# Patient Record
Sex: Male | Born: 1995 | Race: Black or African American | Hispanic: No | Marital: Single | Smoking: Current every day smoker
Health system: Southern US, Community
[De-identification: ages and names within clinical notes are randomized; demographics above are authoritative.]

---
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
## 2018-07-03 ENCOUNTER — Encounter (HOSPITAL_COMMUNITY): Payer: Self-pay | Admitting: Family Medicine

## 2018-07-03 ENCOUNTER — Other Ambulatory Visit: Payer: Self-pay

## 2018-07-03 ENCOUNTER — Ambulatory Visit (HOSPITAL_COMMUNITY)
Admission: EM | Admit: 2018-07-03 | Discharge: 2018-07-03 | Disposition: A | Discharge status: Home / Self Care | Payer: Self-pay | Attending: Family Medicine | Admitting: Family Medicine

## 2018-07-03 DIAGNOSIS — L739 Follicular disorder, unspecified: Secondary | ICD-10-CM

## 2018-07-03 MED ORDER — DOXYCYCLINE HYCLATE 100 MG PO TABS: 100.0000 mg | ORAL_TABLET | Freq: Two times a day (BID) | ORAL | 0 refills | Status: DC

## 2018-07-03 NOTE — ED Provider Notes (Signed)
[place]    CSN: [id number] Arrival date & time: 07/03/18  1455     History   Chief Complaint Chief Complaint  Patient presents with  . Rash    HPI Joshua Harvey is a 23 y.o. male.   This is the initial visit for this 23 year old man to [facility]  Patient's chief complaint today is a rash.  He has progressive papules on hips, sides of upper legs, shaft of penis and umbilicus.  The umbilical one is draining.  He's had this before.  It began this time about a week ago.  No fever.     History reviewed. No pertinent past medical history.  There are no active problems to display for this patient.   History reviewed. No pertinent surgical history.     Home Medications    Prior to Admission medications   Medication Sig Start Date End Date Taking? Authorizing Provider  doxycycline (VIBRA-TABS) 100 MG tablet Take 1 tablet (100 mg total) by mouth 2 (two) times daily. 07/03/18   Robyn Haber, MD    Family History History reviewed. No pertinent family history.  Social History Social History   Tobacco Use  . Smoking status: Current Every Day Smoker    Types: Cigarettes  . Smokeless tobacco: Never Used  Substance Use Topics  . Alcohol use: Never    Frequency: Never  . Drug use: Never     Allergies   Patient has no allergy information on record.   Review of Systems Review of Systems  Constitutional: Negative.   Skin: Positive for rash.  All other systems reviewed and are negative.    Physical Exam Triage Vital Signs ED Triage Vitals  Enc Vitals Group     BP      Pulse      Resp      Temp      Temp src      SpO2      Weight      Height      Head Circumference      Peak Flow      Pain Score      Pain Loc      Pain Edu?      Excl. in [place]?    No data found.  Updated Vital Signs There were no vitals taken for this visit.   Physical Exam Vitals signs and nursing note reviewed.  Constitutional:    Appearance: Normal appearance.  HENT:     Head: Normocephalic.  Eyes:     Conjunctiva/sclera: Conjunctivae normal.  Cardiovascular:     Rate and Rhythm: Normal rate.     Pulses: Normal pulses.  Pulmonary:     Effort: Pulmonary effort is normal.  Musculoskeletal: Normal range of motion.  Skin:    General: Skin is warm.     Findings: Erythema and rash present.     Comments: Multiple small furuncles.  Neurological:     General: No focal deficit present.     Mental Status: He is alert.  Psychiatric:        Mood and Affect: Mood normal.      UC Treatments / Results  Labs (all labs ordered are listed, but only abnormal results are displayed) Labs Reviewed - No data to display  EKG None  Radiology No results found.  Procedures Procedures (including critical care time)  Medications Ordered in UC Medications - No data to display  Initial Impression / Assessment and Plan /  UC Course  I have reviewed the triage vital signs and the nursing notes.  Pertinent labs & imaging results that were available during my care of the patient were reviewed by me and considered in my medical decision making (see chart for details).    Final Clinical Impressions(s) / UC Diagnoses   Final diagnoses:  Folliculitis   Discharge Instructions   None    ED Prescriptions    Medication Sig Dispense Auth. Provider   doxycycline (VIBRA-TABS) 100 MG tablet Take 1 tablet (100 mg total) by mouth 2 (two) times daily. 20 tablet Elvina SidleLauenstein, Lessie Manigo, MD     Controlled Substance Prescriptions [place] Controlled Substance Registry consulted? Not Applicable   Elvina SidleLauenstein, Kimble Delaurentis, MD 07/03/18 [phone]

## 2018-07-03 NOTE — ED Triage Notes (Signed)
Per pt he noticed a few raised area on his bottom first then they spread to his stomach and genital area and very itchy.

## 2018-12-17 ENCOUNTER — Other Ambulatory Visit: Payer: Self-pay

## 2018-12-17 ENCOUNTER — Ambulatory Visit (HOSPITAL_COMMUNITY)
Admission: EM | Admit: 2018-12-17 | Discharge: 2018-12-17 | Disposition: A | Discharge status: Home / Self Care | Payer: Self-pay | Attending: Family Medicine | Admitting: Family Medicine

## 2018-12-17 ENCOUNTER — Encounter (HOSPITAL_COMMUNITY): Payer: Self-pay

## 2018-12-17 DIAGNOSIS — Z113 Encounter for screening for infections with a predominantly sexual mode of transmission: Secondary | ICD-10-CM | POA: Insufficient documentation

## 2018-12-17 NOTE — Discharge Instructions (Addendum)
We will cal you with any positive results or you can check your my chart for results.

## 2018-12-17 NOTE — ED Provider Notes (Signed)
[place]    CSN: [id number] Arrival date & time: 12/17/18  1150      History   Chief Complaint Chief Complaint  Patient presents with  . STD Testing    HPI SHISHIR KRANTZ is a 23 y.o. male.   Patient is a 23 year old male the presents today for STD testing.  Patient sexual active with new partner.  Reports patient was having concerning symptoms.  He currently denies any symptoms to include penile discharge, penile swelling, testicle pain or swelling, dysuria, hematuria or urinary frequency.  No rashes.  ROS per HPI      History reviewed. No pertinent past medical history.  There are no active problems to display for this patient.   History reviewed. No pertinent surgical history.     Home Medications    Prior to Admission medications   Not on File    Family History Family History  Family history unknown: Yes    Social History Social History   Tobacco Use  . Smoking status: Current Every Day Smoker    Types: Cigarettes  . Smokeless tobacco: Never Used  Substance Use Topics  . Alcohol use: Never    Frequency: Never  . Drug use: Never     Allergies   Patient has no known allergies.   Review of Systems Review of Systems   Physical Exam Triage Vital Signs ED Triage Vitals  Enc Vitals Group     BP 12/17/18 1221 137/88     Pulse Rate 12/17/18 1221 91     Resp 12/17/18 1221 18     Temp 12/17/18 1221 98.5 F (36.9 C)     Temp Source 12/17/18 1221 Oral     SpO2 12/17/18 1221 100 %     Weight --      Height --      Head Circumference --      Peak Flow --      Pain Score 12/17/18 1222 0     Pain Loc --      Pain Edu? --      Excl. in [place]? --    No data found.  Updated Vital Signs BP 137/88 (BP Location: Right Arm)   Pulse 91   Temp 98.5 F (36.9 C) (Oral)   Resp 18   SpO2 100%   Visual Acuity Right Eye Distance:   Left Eye Distance:   Bilateral Distance:    Right Eye Near:   Left Eye Near:    Bilateral Near:      Physical Exam Vitals signs and nursing note reviewed.  Constitutional:      Appearance: Normal appearance.  HENT:     Head: Normocephalic and atraumatic.     Nose: Nose normal.  Eyes:     Conjunctiva/sclera: Conjunctivae normal.  Neck:     Musculoskeletal: Normal range of motion.  Pulmonary:     Effort: Pulmonary effort is normal.  Musculoskeletal: Normal range of motion.  Skin:    General: Skin is warm and dry.  Neurological:     Mental Status: He is alert.  Psychiatric:        Mood and Affect: Mood normal.      UC Treatments / Results  Labs (all labs ordered are listed, but only abnormal results are displayed) Labs Reviewed  CYTOLOGY, (ORAL, ANAL, URETHRAL) ANCILLARY ONLY    EKG   Radiology No results found.  Procedures Procedures (including critical care time)  Medications Ordered in UC Medications - No  data to display  Initial Impression / Assessment and Plan / UC Course  I have reviewed the triage vital signs and the nursing notes.  Pertinent labs & imaging results that were available during my care of the patient were reviewed by me and considered in my medical decision making (see chart for details).     STD screening-patient asymptomatic and deferring treatment Swab sent for testing labs pending Final Clinical Impressions(s) / UC Diagnoses   Final diagnoses:  Screening for STD (sexually transmitted disease)     Discharge Instructions     We will cal you with any positive results or you can check your my chart for results.     ED Prescriptions    None     PDMP not reviewed this encounter.   Dahlia Byes A, NP 12/17/18 1441

## 2018-12-17 NOTE — ED Triage Notes (Signed)
Pt presents for STD testing; pt states he is not having any symptoms but his partner is.

## 2018-12-20 LAB — CYTOLOGY, (ORAL, ANAL, URETHRAL) ANCILLARY ONLY
Chlamydia: NEGATIVE
Neisseria Gonorrhea: NEGATIVE
Trichomonas: NEGATIVE

## 2019-03-14 ENCOUNTER — Other Ambulatory Visit: Payer: Self-pay

## 2019-03-14 ENCOUNTER — Encounter (HOSPITAL_COMMUNITY): Payer: Self-pay

## 2019-03-14 ENCOUNTER — Ambulatory Visit (HOSPITAL_COMMUNITY)
Admission: EM | Admit: 2019-03-14 | Discharge: 2019-03-14 | Disposition: A | Discharge status: Home / Self Care | Payer: Self-pay | Attending: Family Medicine | Admitting: Family Medicine

## 2019-03-14 DIAGNOSIS — L739 Follicular disorder, unspecified: Secondary | ICD-10-CM

## 2019-03-14 MED ORDER — DOXYCYCLINE HYCLATE 100 MG PO CAPS: 100.0000 mg | ORAL_CAPSULE | Freq: Two times a day (BID) | ORAL | 0 refills | Status: DC

## 2019-03-14 NOTE — Discharge Instructions (Addendum)
Consider antibacterial soap Use hypo-allergenic products Return as needed

## 2019-03-14 NOTE — ED Triage Notes (Signed)
Pt c/o swollen "lump" in right axilla with some itching for approx 1 month. States h/o of similar symptoms in various locations and reports he has been dx with folliculitis in past. Denies fever, chills.

## 2019-03-14 NOTE — ED Provider Notes (Signed)
[facility]    CSN: [id number] Arrival date & time: 03/14/19  1751      History   Chief Complaint Chief Complaint  Patient presents with  . Abscess    HPI Joshua Harvey is a 24 y.o. male.   HPI Patient states he has had recurring skin infections.  He has been diagnosed with folliculitis.  He has been treated with doxycycline.  He has a recurrence of bumps under his right arm.  History reviewed. No pertinent past medical history.  There are no problems to display for this patient.   History reviewed. No pertinent surgical history.     Home Medications    Prior to Admission medications   Medication Sig Start Date End Date Taking? Authorizing Provider  doxycycline (VIBRAMYCIN) 100 MG capsule Take 1 capsule (100 mg total) by mouth 2 (two) times daily. 03/14/19   Eustace Moore, MD    Family History Family History  Family history unknown: Yes    Social History Social History   Tobacco Use  . Smoking status: Current Every Day Smoker    Types: Cigarettes  . Smokeless tobacco: Never Used  Substance Use Topics  . Alcohol use: Yes  . Drug use: Yes    Types: Marijuana     Allergies   Patient has no known allergies.   Review of Systems Review of Systems  Skin: Positive for rash.     Physical Exam Triage Vital Signs ED Triage Vitals  Enc Vitals Group     BP 03/14/19 1813 122/67     Pulse Rate 03/14/19 1813 95     Resp 03/14/19 1813 20     Temp 03/14/19 1813 98.7 F (37.1 C)     Temp Source 03/14/19 1813 Oral     SpO2 03/14/19 1813 100 %     Weight --      Height --      Head Circumference --      Peak Flow --      Pain Score 03/14/19 1811 3     Pain Loc --      Pain Edu? --      Excl. in [place]? --    No data found.  Updated Vital Signs BP 122/67 (BP Location: Right Arm)   Pulse 95   Temp 98.7 F (37.1 C) (Oral)   Resp 20   SpO2 100%   Visual Acuity Right Eye Distance:   Left Eye Distance:   Bilateral Distance:     Right Eye Near:   Left Eye Near:    Bilateral Near:     Physical Exam Constitutional:      General: He is not in acute distress.    Appearance: Normal appearance. He is well-developed and normal weight.     Comments: Pleasant.  Cooperative  HENT:     Head: Normocephalic and atraumatic.     Mouth/Throat:     Comments: Mask is in place Eyes:     Conjunctiva/sclera: Conjunctivae normal.     Pupils: Pupils are equal, round, and reactive to light.  Cardiovascular:     Rate and Rhythm: Normal rate and regular rhythm.     Heart sounds: Normal heart sounds.  Pulmonary:     Effort: Pulmonary effort is normal. No respiratory distress.  Musculoskeletal:        General: Normal range of motion.     Cervical back: Normal range of motion.  Skin:    General: Skin is warm and  dry.     Comments: Right axilla has a 1 cm tender nodule.  There are couple other follicles.  No abscess requiring treatment  Neurological:     Mental Status: He is alert. Mental status is at baseline.  Psychiatric:        Mood and Affect: Mood normal.        Behavior: Behavior normal.      UC Treatments / Results  Labs (all labs ordered are listed, but only abnormal results are displayed) Labs Reviewed - No data to display  EKG   Radiology No results found.  Procedures Procedures (including critical care time)  Medications Ordered in UC Medications - No data to display  Initial Impression / Assessment and Plan / UC Course  I have reviewed the triage vital signs and the nursing notes.  Pertinent labs & imaging results that were available during my care of the patient were reviewed by me and considered in my medical decision making (see chart for details).     *Patient appears healthy.  He has good hygiene.  I explained to him that some people do have sensitive skin with recurring infections in spite of good care.  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 Final Clinical Impressions(s) / UC  Diagnoses   Final diagnoses:  Folliculitis     Discharge Instructions     Consider antibacterial soap Use hypo-allergenic products Return as needed   ED Prescriptions    Medication Sig Dispense Auth. Provider   doxycycline (VIBRAMYCIN) 100 MG capsule Take 1 capsule (100 mg total) by mouth 2 (two) times daily. 20 capsule Raylene Everts, MD     PDMP not reviewed this encounter.   Raylene Everts, MD 03/14/19 [phone]

## 2020-01-29 ENCOUNTER — Emergency Department (HOSPITAL_COMMUNITY)
Admission: EM | Admit: 2020-01-29 | Discharge: 2020-01-29 | Disposition: A | Discharge status: Home / Self Care | Payer: Self-pay | Attending: Emergency Medicine | Admitting: Emergency Medicine

## 2020-01-29 ENCOUNTER — Encounter (HOSPITAL_COMMUNITY): Payer: Self-pay

## 2020-01-29 ENCOUNTER — Emergency Department (HOSPITAL_COMMUNITY): Payer: Self-pay

## 2020-01-29 ENCOUNTER — Other Ambulatory Visit: Payer: Self-pay

## 2020-01-29 DIAGNOSIS — F17210 Nicotine dependence, cigarettes, uncomplicated: Secondary | ICD-10-CM | POA: Insufficient documentation

## 2020-01-29 DIAGNOSIS — M25572 Pain in left ankle and joints of left foot: Secondary | ICD-10-CM | POA: Insufficient documentation

## 2020-01-29 DIAGNOSIS — M25562 Pain in left knee: Secondary | ICD-10-CM | POA: Insufficient documentation

## 2020-01-29 NOTE — Discharge Instructions (Signed)
Please read and follow all provided instructions.  Your diagnoses today include:  1. Acute left ankle pain   2. Acute pain of left knee     Tests performed today include:  An x-ray of the affected area - does NOT show any broken bones  Vital signs. See below for your results today.   Medications prescribed:  Please use over-the-counter NSAID medications (ibuprofen, naproxen) as directed on the packaging for pain if you do not have any reasons not to take these medications just as weak kidneys or a history of bleeding in your stomach or gut.   Take any prescribed medications only as directed.  Home care instructions:   Follow any educational materials contained in this packet  Follow R.I.C.E. Protocol:  R - rest your injury   I  - use ice on injury without applying directly to skin  C - compress injury with bandage or splint  E - elevate the injury as much as possible  Follow-up instructions: Please follow-up with your primary care provider if you continue to have significant pain in 1 week. In this case you may have a more severe injury that requires further care.   Return instructions:   Please return if your toes or feet are numb or tingling, appear gray or blue, or you have severe pain (also elevate the leg and loosen splint or wrap if you were given one)  Please return to the Emergency Department if you experience worsening symptoms.   Please return if you have any other emergent concerns.  Additional Information:  Your vital signs today were: BP 125/68 (BP Location: Right Arm)    Pulse 82    Temp 99.5 F (37.5 C) (Oral)    Resp 16    SpO2 98%  If your blood pressure (BP) was elevated above 135/85 this visit, please have this repeated by your doctor within one month. -------------- If prescribed crutches for your injury: use crutches with non-weight bearing for the first few days. Then, you may walk as the pain allows, or as instructed. Start gradually with  weight bearing on the affected side. Once you can walk pain free, then try jogging. When you can run forwards, then you can try moving side-to-side. If you cannot walk without crutches in one week, you need a re-check. --------------

## 2020-01-29 NOTE — ED Provider Notes (Signed)
[facility] EMERGENCY DEPARTMENT Provider Note   CSN: [id number] Arrival date & time: 01/29/20  6213     History No chief complaint on file.   Joshua Harvey is a 25 y.o. male.  Patient presents the emergency department for evaluation of acute onset of left knee and ankle pain starting last night.  Patient states that he was "wrestling around" and afterwards noted that he was having pain and difficulty with ambulation.  No treatments prior to arrival.  No weakness or numbness.  Denies other injuries.        History reviewed. No pertinent past medical history.  There are no problems to display for this patient.   History reviewed. No pertinent surgical history.     Family History  Family history unknown: Yes    Social History   Tobacco Use  . Smoking status: Current Every Day Smoker    Types: Cigarettes  . Smokeless tobacco: Never Used  Vaping Use  . Vaping Use: Never used  Substance Use Topics  . Alcohol use: Yes  . Drug use: Yes    Types: Marijuana    Home Medications Prior to Admission medications   Not on File    Allergies    Patient has no known allergies.  Review of Systems   Review of Systems  Constitutional: Negative for activity change.  Musculoskeletal: Positive for arthralgias and gait problem. Negative for back pain, joint swelling and neck pain.  Skin: Negative for wound.  Neurological: Negative for weakness and numbness.    Physical Exam Updated Vital Signs BP 125/68 (BP Location: Right Arm)   Pulse 82   Temp 99.5 F (37.5 C) (Oral)   Resp 16   SpO2 98%   Physical Exam Vitals and nursing note reviewed.  Constitutional:      Appearance: He is well-developed and well-nourished.  HENT:     Head: Normocephalic and atraumatic.  Eyes:     Conjunctiva/sclera: Conjunctivae normal.  Cardiovascular:     Pulses: Normal pulses. No decreased pulses.  Musculoskeletal:        General: Tenderness present. No edema.      Cervical back: Normal range of motion and neck supple.     Left hip: No tenderness or bony tenderness. Normal range of motion.     Left knee: No swelling or effusion. Normal range of motion. Tenderness present over the medial joint line.     Left ankle: Tenderness present over the medial malleolus. No proximal fibula tenderness. Normal range of motion.  Skin:    General: Skin is warm and dry.  Neurological:     Mental Status: He is alert.     Sensory: No sensory deficit.     Comments: Motor, sensation, and vascular distal to the injury is fully intact.   Psychiatric:        Mood and Affect: Mood and affect normal.     ED Results / Procedures / Treatments   Labs (all labs ordered are listed, but only abnormal results are displayed) Labs Reviewed - No data to display  EKG None  Radiology DG Ankle Complete Left  Result Date: 01/29/2020 CLINICAL DATA:  Left ankle pain, no injury. EXAM: LEFT ANKLE COMPLETE - 3+ VIEW COMPARISON:  None. FINDINGS: There is no evidence of fracture, dislocation, or joint effusion. There is no evidence of arthropathy or other focal bone abnormality. Soft tissues are unremarkable. IMPRESSION: Negative. Electronically Signed   By: Sherian Rein M.D.   On: 01/29/2020  09:47   DG Knee Complete 4 Views Left  Result Date: 01/29/2020 CLINICAL DATA:  Left knee pain.  No injury. EXAM: LEFT KNEE - COMPLETE 4+ VIEW COMPARISON:  None. FINDINGS: No evidence of fracture, dislocation, or joint effusion. No evidence of arthropathy or other focal bone abnormality. Soft tissues are unremarkable. IMPRESSION: Negative. Electronically Signed   By: Sherian Rein M.D.   On: 01/29/2020 09:47    Procedures Procedures (including critical care time)  Medications Ordered in ED Medications - No data to display  ED Course  I have reviewed the triage vital signs and the nursing notes.  Pertinent labs & imaging results that were available during my care of the patient were reviewed by  me and considered in my medical decision making (see chart for details).  Patient seen and examined.  X-ray results reviewed.  Vital signs reviewed and are as follows: BP 125/68 (BP Location: Right Arm)   Pulse 82   Temp 99.5 F (37.5 C) (Oral)   Resp 16   SpO2 98%    We will provide with crutches and Ace wrap.  Patient was counseled on RICE protocol and told to rest injury, use ice for no longer than 15 minutes every hour, compress the area, and elevate above the level of their heart as much as possible to reduce swelling. Questions answered. Patient verbalized understanding.       MDM Rules/Calculators/A&P                          Patient with knee and ankle injury yesterday.  Imaging negative.  Exam reassuring.  Suspect sprain.  Lower extremities neurovascularly intact.   Final Clinical Impression(s) / ED Diagnoses Final diagnoses:  Acute left ankle pain  Acute pain of left knee    Rx / DC Orders ED Discharge Orders    None       Renne Crigler, Cordelia Poche 01/29/20 1011    Tilden Fossa, MD 01/29/20 1224

## 2020-01-29 NOTE — ED Triage Notes (Signed)
Patient complains of left knee and ankle pain after tweaking last night, pain with ambulation

## 2020-01-29 NOTE — ED Notes (Signed)
Pt educated on how to use crutches and able to demonstrate proper use.  Left knee wrapped with ACE bandage, pt educated how to wrap knee and precautions to use

## 2020-05-20 ENCOUNTER — Ambulatory Visit (INDEPENDENT_AMBULATORY_CARE_PROVIDER_SITE_OTHER): Payer: Self-pay

## 2020-05-20 ENCOUNTER — Encounter (HOSPITAL_COMMUNITY): Payer: Self-pay | Admitting: Emergency Medicine

## 2020-05-20 ENCOUNTER — Ambulatory Visit (HOSPITAL_COMMUNITY)
Admission: EM | Admit: 2020-05-20 | Discharge: 2020-05-20 | Disposition: A | Discharge status: Home / Self Care | Payer: Self-pay | Attending: Internal Medicine | Admitting: Internal Medicine

## 2020-05-20 ENCOUNTER — Other Ambulatory Visit: Payer: Self-pay

## 2020-05-20 DIAGNOSIS — M25572 Pain in left ankle and joints of left foot: Secondary | ICD-10-CM

## 2020-05-20 DIAGNOSIS — M79641 Pain in right hand: Secondary | ICD-10-CM

## 2020-05-20 DIAGNOSIS — S62306A Unspecified fracture of fifth metacarpal bone, right hand, initial encounter for closed fracture: Secondary | ICD-10-CM

## 2020-05-20 DIAGNOSIS — M7989 Other specified soft tissue disorders: Secondary | ICD-10-CM

## 2020-05-20 DIAGNOSIS — W19XXXA Unspecified fall, initial encounter: Secondary | ICD-10-CM

## 2020-05-20 DIAGNOSIS — M79643 Pain in unspecified hand: Secondary | ICD-10-CM
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
## 2020-05-20 NOTE — Progress Notes (Signed)
Orthopedic Tech Progress Note Patient Details:  RY MOODY 1995/10/22 [id number]  Ortho Devices Type of Ortho Device: Ulna gutter splint Ortho Device/Splint Location: Right Upper Extremity Ortho Device/Splint Interventions: Ordered,Application   Post Interventions Patient Tolerated: Well Instructions Provided: Care of device,Poper ambulation with device   Laquonda Welby P Harle Stanford 05/20/2020, 1:36 PM
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
## 2020-05-20 NOTE — Discharge Instructions (Addendum)
-  Call orthopedist for for follow-up and permanent cast. Information below. Call them tomorrow to schedule this appointment. -For pain, Take Tylenol 1000 mg 3 times daily, and ibuprofen 800 mg 3 times daily with food.  You can take these together, or alternate every 3-4 hours. You can also try ice. Elevate the hand to reduce swelling.

## 2020-09-29 ENCOUNTER — Other Ambulatory Visit: Payer: Self-pay

## 2020-09-29 ENCOUNTER — Encounter (HOSPITAL_COMMUNITY): Payer: Self-pay

## 2020-09-29 ENCOUNTER — Ambulatory Visit (HOSPITAL_COMMUNITY)
Admission: EM | Admit: 2020-09-29 | Discharge: 2020-09-29 | Disposition: A | Discharge status: Home / Self Care | Payer: Self-pay | Attending: Emergency Medicine | Admitting: Emergency Medicine

## 2020-09-29 DIAGNOSIS — L03319 Cellulitis of trunk, unspecified: Secondary | ICD-10-CM

## 2020-09-29 DIAGNOSIS — L03111 Cellulitis of right axilla: Secondary | ICD-10-CM

## 2020-09-29 DIAGNOSIS — L02411 Cutaneous abscess of right axilla: Secondary | ICD-10-CM

## 2020-09-29 DIAGNOSIS — L02219 Cutaneous abscess of trunk, unspecified: Secondary | ICD-10-CM

## 2020-09-29 MED ORDER — CEPHALEXIN 500 MG PO CAPS: 500.0000 mg | ORAL_CAPSULE | Freq: Four times a day (QID) | ORAL | 0 refills | Status: AC

## 2020-09-29 MED ORDER — LIDOCAINE HCL (PF) 1 % IJ SOLN
INTRAMUSCULAR | Status: AC
  Filled 2020-09-29: qty 2

## 2020-09-29 MED ORDER — CEFTRIAXONE SODIUM 1 G IJ SOLR
1.0000 g | Freq: Once | INTRAMUSCULAR | Status: AC
  Administered 2020-09-29: 1 g via INTRAMUSCULAR

## 2020-09-29 MED ORDER — CEFTRIAXONE SODIUM 1 G IJ SOLR
INTRAMUSCULAR | Status: AC
  Filled 2020-09-29: qty 10

## 2020-09-29 MED ORDER — LIDOCAINE HCL (PF) 2 % IJ SOLN
INTRAMUSCULAR | Status: AC
  Filled 2020-09-29: qty 5

## 2020-09-29 NOTE — ED Triage Notes (Signed)
Pt present abscess underneath right armpit. Pt states the area is getting bigger and hard from him to put him arm down. Symptoms started four days ago.

## 2020-09-29 NOTE — ED Provider Notes (Signed)
[facility]    CSN: [id number] Arrival date & time: 09/29/20  1600      History   Chief Complaint Chief Complaint  Patient presents with   Abscess    HPI Joshua Harvey is a 25 y.o. male.   Patient complains of a progressively enlarging, painful abscess under his right axilla.  States the area is so painful now he is unable to lower his arm.  Patient denies fever, aches, chills, nausea, vomiting, diarrhea.  Vital signs are stable today.   Abscess Abscess location: Right axilla. Size:  8 cm x 5 cm Abscess quality: fluctuance, painful and redness   Red streaking: no   Duration:  4 days Progression:  Worsening Pain details:    Quality:  Pressure and hot   Severity:  Moderate   Duration:  4 days   Timing:  Constant   Progression:  Worsening Chronicity:  New Relieved by:  None tried Worsened by:  Nothing Ineffective treatments:  None tried Associated symptoms: no anorexia, no fatigue, no fever, no headaches, no nausea and no vomiting   Risk factors: prior abscess   Risk factors: no family hx of MRSA and no hx of MRSA    History reviewed. No pertinent past medical history.  There are no problems to display for this patient.   History reviewed. No pertinent surgical history.     Home Medications    Prior to Admission medications   Medication Sig Start Date End Date Taking? Authorizing Provider  cephALEXin (KEFLEX) 500 MG capsule Take 1 capsule (500 mg total) by mouth 4 (four) times daily for 10 days. 09/29/20 10/09/20 Yes Theadora Rama, PA-C    Family History Family History  Family history unknown: Yes    Social History Social History   Tobacco Use   Smoking status: Every Day    Types: Cigarettes   Smokeless tobacco: Never  Vaping Use   Vaping Use: Never used  Substance Use Topics   Alcohol use: Yes   Drug use: Yes    Types: Marijuana     Allergies   Patient has no known allergies.   Review of Systems Review of Systems   Constitutional:  Negative for fatigue and fever.  Gastrointestinal:  Negative for anorexia, nausea and vomiting.  Neurological:  Negative for headaches.    Physical Exam Triage Vital Signs ED Triage Vitals  Enc Vitals Group     BP 09/29/20 1627 129/87     Pulse Rate 09/29/20 1627 89     Resp 09/29/20 1627 18     Temp 09/29/20 1627 97.8 F (36.6 C)     Temp Source 09/29/20 1627 Oral     SpO2 09/29/20 1627 99 %     Weight --      Height --      Head Circumference --      Peak Flow --      Pain Score 09/29/20 1626 8     Pain Loc --      Pain Edu? --      Excl. in GC? --    No data found.  Updated Vital Signs BP 129/87 (BP Location: Left Arm)   Pulse 89   Temp 97.8 F (36.6 C) (Oral)   Resp 18   SpO2 99%   Visual Acuity Right Eye Distance:   Left Eye Distance:   Bilateral Distance:    Right Eye Near:   Left Eye Near:    Bilateral Near:  Physical Exam Constitutional:      Appearance: Normal appearance.  HENT:     Head: Normocephalic and atraumatic.  Skin:    General: Skin is warm and dry.     Findings: Lesion (3 cm x 5 cm fluctuant abscess present at right axilla.) present.  Neurological:     Mental Status: He is alert.     UC Treatments / Results  Labs (all labs ordered are listed, but only abnormal results are displayed) Labs Reviewed - No data to display  EKG   Radiology No results found.  Procedures Incision and Drainage  Date/Time: 09/29/2020 6:09 PM Performed by: Theadora Rama, PA-C Authorized by: Theadora Rama, PA-C   Consent:    Consent obtained:  Verbal   Consent given by:  Patient   Risks, benefits, and alternatives were discussed: yes     Risks discussed:  Bleeding, incomplete drainage, pain, infection and damage to other organs   Alternatives discussed:  No treatment, delayed treatment, alternative treatment and observation Universal protocol:    Procedure explained and questions answered to patient or proxy's  satisfaction: yes     Site/side marked: yes     Immediately prior to procedure, a time out was called: yes     Patient identity confirmed:  Verbally with patient Location:    Type:  Abscess   Size:  3 cm x 5 cm   Location: Right axilla. Pre-procedure details:    Skin preparation:  Povidone-iodine Sedation:    Sedation type:  None Anesthesia:    Anesthesia method:  Local infiltration Procedure type:    Complexity:  Simple Procedure details:    Ultrasound guidance: no     Needle aspiration: no     Incision types:  Stab incision   Incision depth:  Subcutaneous   Wound management:  Probed and deloculated, irrigated with saline and extensive cleaning   Drainage:  Bloody and purulent   Drainage amount:  Copious   Wound treatment:  Wound left open and drain placed   Packing materials:  1/4 in iodoform gauze Post-procedure details:    Procedure completion:  Tolerated Comments:     Ductions provided to patient to advance iodoform by 1 inch daily until gone.  Patient also advised to follow-up in 3 days to have wound recheck. (including critical care time)  Medications Ordered in UC Medications  cefTRIAXone (ROCEPHIN) injection 1 g (1 g Intramuscular Given 09/29/20 1759)    Initial Impression / Assessment and Plan / UC Course  I have reviewed the triage vital signs and the nursing notes.  Pertinent labs & imaging results that were available during my care of the patient were reviewed by me and considered in my medical decision making (see chart for details).     Patient tolerated incision and drainage well.  Patient provided with injection of ceftriaxone for broad-spectrum antibiotic coverage.  We will follow-up with Keflex for 10 days.  Education for wound care provided to patient.  Patient asked to follow-up in 3 days for wound recheck. Final Clinical Impressions(s) / UC Diagnoses   Final diagnoses:  None     Discharge Instructions      These be sure you pull out  approximately 1 inch of the packing daily from the wound and trim it off.  Please take your antibiotics as prescribed.  Please follow up in 3 days to have your wound rechecked either here or with your primary care provider.   Sooner should you begin to experience increased pain  from the wound, foul-smelling drainage, increased redness surrounding the wound.     ED Prescriptions     Medication Sig Dispense Auth. Provider   cephALEXin (KEFLEX) 500 MG capsule Take 1 capsule (500 mg total) by mouth 4 (four) times daily for 10 days. 40 capsule Theadora Rama, PA-C      PDMP not reviewed this encounter.   Theadora Rama, PA-C 09/29/20 1851

## 2020-09-29 NOTE — Discharge Instructions (Addendum)
These be sure you pull out approximately 1 inch of the packing daily from the wound and trim it off.  Please take your antibiotics as prescribed.  Please follow up in 3 days to have your wound rechecked either here or with your primary care provider.   Sooner should you begin to experience increased pain from the wound, foul-smelling drainage, increased redness surrounding the wound.

## 2020-12-03 ENCOUNTER — Encounter (HOSPITAL_COMMUNITY): Payer: Self-pay | Admitting: Emergency Medicine

## 2020-12-03 ENCOUNTER — Ambulatory Visit (HOSPITAL_COMMUNITY)
Admission: EM | Admit: 2020-12-03 | Discharge: 2020-12-03 | Disposition: A | Discharge status: Home / Self Care | Payer: Self-pay | Attending: Internal Medicine | Admitting: Internal Medicine

## 2020-12-03 DIAGNOSIS — L739 Follicular disorder, unspecified: Secondary | ICD-10-CM

## 2020-12-03 MED ORDER — DOXYCYCLINE HYCLATE 100 MG PO CAPS: 100.0000 mg | ORAL_CAPSULE | Freq: Two times a day (BID) | ORAL | 0 refills | Status: AC

## 2020-12-03 NOTE — Discharge Instructions (Signed)
Take medications as prescribed Warm compresses to the right axilla If the area develops fluctuance please return to urgent care for incision and drainage.

## 2020-12-03 NOTE — ED Triage Notes (Signed)
Pt c/o new abscess/bump came up in right axilla a couple days ago. Reports had one drained before but new one has came up and was told to come back if gets more.

## 2020-12-03 NOTE — ED Provider Notes (Signed)
[facility]    CSN: [id number] Arrival date & time: 12/03/20  1125      History   Chief Complaint Chief Complaint  Patient presents with   Abscess    HPI Joshua Harvey is a 25 y.o. male comes to the urgent care with painful swelling in the right axilla over the past couple of days.  Patient has a history of folliculitis in the right axilla.  No fever or chills.  Pain is of moderate severity, throbbing, aggravated by palpation.  Patient has not tried any over-the-counter medications yet.  No drainage noted.Marland Kitchen   HPI  History reviewed. No pertinent past medical history.  There are no problems to display for this patient.   History reviewed. No pertinent surgical history.     Home Medications    Prior to Admission medications   Medication Sig Start Date End Date Taking? Authorizing Provider  doxycycline (VIBRAMYCIN) 100 MG capsule Take 1 capsule (100 mg total) by mouth 2 (two) times daily for 7 days. 12/03/20 12/10/20 Yes Jailon Schaible, Britta Mccreedy, MD    Family History Family History  Family history unknown: Yes    Social History Social History   Tobacco Use   Smoking status: Every Day    Types: Cigarettes   Smokeless tobacco: Never  Vaping Use   Vaping Use: Never used  Substance Use Topics   Alcohol use: Yes   Drug use: Yes    Types: Marijuana     Allergies   Patient has no known allergies.   Review of Systems Review of Systems  Skin:  Positive for rash. Negative for color change and wound.    Physical Exam Triage Vital Signs ED Triage Vitals  Enc Vitals Group     BP 12/03/20 1355 115/69     Pulse Rate 12/03/20 1355 71     Resp 12/03/20 1355 15     Temp 12/03/20 1355 98.6 F (37 C)     Temp Source 12/03/20 1355 Oral     SpO2 12/03/20 1355 96 %     Weight --      Height --      Head Circumference --      Peak Flow --      Pain Score 12/03/20 1354 0     Pain Loc --      Pain Edu? --      Excl. in GC? --    No data found.  Updated  Vital Signs BP 115/69 (BP Location: Right Arm)   Pulse 71   Temp 98.6 F (37 C) (Oral)   Resp 15   SpO2 96%   Visual Acuity Right Eye Distance:   Left Eye Distance:   Bilateral Distance:    Right Eye Near:   Left Eye Near:    Bilateral Near:     Physical Exam Vitals and nursing note reviewed.  Constitutional:      General: He is not in acute distress.    Appearance: He is not ill-appearing.  Musculoskeletal:        General: Normal range of motion.  Skin:    Comments: Tender swelling in the right axillary region.  Neurological:     Mental Status: He is alert.     UC Treatments / Results  Labs (all labs ordered are listed, but only abnormal results are displayed) Labs Reviewed - No data to display  EKG   Radiology No results found.  Procedures Procedures (including critical care time)  Medications  Ordered in UC Medications - No data to display  Initial Impression / Assessment and Plan / UC Course  I have reviewed the triage vital signs and the nursing notes.  Pertinent labs & imaging results that were available during my care of the patient were reviewed by me and considered in my medical decision making (see chart for details).     Folliculitis of the right axilla: Doxycycline 100 mg twice daily for 7 days Ibuprofen as needed for pain Warm compresses Return to urgent care if symptoms worsen. Final Clinical Impressions(s) / UC Diagnoses   Final diagnoses:  Folliculitis of right axilla     Discharge Instructions      Take medications as prescribed Warm compresses to the right axilla If the area develops fluctuance please return to urgent care for incision and drainage.   ED Prescriptions     Medication Sig Dispense Auth. Provider   doxycycline (VIBRAMYCIN) 100 MG capsule Take 1 capsule (100 mg total) by mouth 2 (two) times daily for 7 days. 14 capsule Taunia Frasco, Britta Mccreedy, MD      PDMP not reviewed this encounter.   Merrilee Jansky,  MD 12/03/20 1438

## 2021-01-10 ENCOUNTER — Ambulatory Visit (HOSPITAL_COMMUNITY): Admission: EM | Admit: 2021-01-10 | Discharge: 2021-01-10 | Discharge status: Against Medical Advice | Payer: Self-pay

## 2021-01-10 ENCOUNTER — Other Ambulatory Visit: Payer: Self-pay

## 2021-01-10 NOTE — ED Notes (Signed)
Called in lobby no answered.  

## 2021-01-10 NOTE — ED Notes (Signed)
Pt no answered.

## 2021-01-10 NOTE — ED Notes (Signed)
Called no answered.  

## 2021-07-01 ENCOUNTER — Encounter (HOSPITAL_COMMUNITY): Payer: Self-pay | Admitting: *Deleted

## 2021-07-01 ENCOUNTER — Other Ambulatory Visit: Payer: Self-pay

## 2021-07-01 ENCOUNTER — Ambulatory Visit (HOSPITAL_COMMUNITY)
Admission: EM | Admit: 2021-07-01 | Discharge: 2021-07-01 | Disposition: A | Discharge status: Home / Self Care | Payer: PRIVATE HEALTH INSURANCE | Attending: Urgent Care | Admitting: Urgent Care

## 2021-07-01 DIAGNOSIS — B370 Candidal stomatitis: Secondary | ICD-10-CM | POA: Diagnosis not present

## 2021-07-01 LAB — POCT RAPID STREP A, ED / UC: Streptococcus, Group A Screen (Direct): NEGATIVE

## 2021-07-01 MED ORDER — NYSTATIN 100000 UNIT/ML MT SUSP: 500000.0000 [IU] | Freq: Four times a day (QID) | OROMUCOSAL | 0 refills | Status: DC

## 2021-07-01 NOTE — ED Triage Notes (Signed)
Pt reports this is the second day his mouth has been itching and swollen. No resp.distress and Pt speaking in full sentences.

## 2021-07-01 NOTE — ED Provider Notes (Signed)
[facility]    CSN: [id number] Arrival date & time: 07/01/21  1404      History   Chief Complaint Chief Complaint  Patient presents with   thoat swelling   roof of mouth itching    HPI Joshua Harvey is a 26 y.o. male.   26 year old male presents today with a 1 day history of feeling like the roof of his mouth is swollen and itchy.  He states it hurts to swallow.  He denies any allergies.  He denies any hives, dysphagia, drooling, fever, rash, nausea vomiting diarrhea, abdominal pain.  He states it started abruptly yesterday, but not in association with anything he can think of.  He was uncertain what to take for it, so tried no over-the-counter remedies. He does use mouthwash and felt this helped some.  He has no chronic medical conditions and takes no daily medications.  He denies any shortness of breath, difficulty breathing, wheezing.  He states it has not worsened and has been constant and stable since yesterday. Pt admits to smoking.    History reviewed. No pertinent past medical history.  There are no problems to display for this patient.   History reviewed. No pertinent surgical history.     Home Medications    Prior to Admission medications   Medication Sig Start Date End Date Taking? Authorizing Provider  nystatin (MYCOSTATIN) 100000 UNIT/ML suspension Take 5 mLs (500,000 Units total) by mouth 4 (four) times daily. Swish in mouth for 5 minutes prior to spitting out 07/01/21  Yes Ieisha Gao, Jodelle Gross, PA    Family History Family History  Family history unknown: Yes    Social History Social History   Tobacco Use   Smoking status: Every Day    Types: Cigarettes   Smokeless tobacco: Never  Vaping Use   Vaping Use: Never used  Substance Use Topics   Alcohol use: Yes   Drug use: Yes    Types: Marijuana     Allergies   Patient has no known allergies.   Review of Systems Review of Systems As per hpi  Physical Exam Triage Vital Signs ED  Triage Vitals  Enc Vitals Group     BP 07/01/21 1519 (!) 129/95     Pulse Rate 07/01/21 1519 73     Resp 07/01/21 1519 18     Temp 07/01/21 1519 99 F (37.2 C)     Temp src --      SpO2 07/01/21 1519 100 %     Weight --      Height --      Head Circumference --      Peak Flow --      Pain Score 07/01/21 1518 9     Pain Loc --      Pain Edu? --      Excl. in GC? --    No data found.  Updated Vital Signs BP (!) 129/95   Pulse 73   Temp 99 F (37.2 C)   Resp 18   SpO2 100%   Visual Acuity Right Eye Distance:   Left Eye Distance:   Bilateral Distance:    Right Eye Near:   Left Eye Near:    Bilateral Near:     Physical Exam Vitals and nursing note reviewed.  Constitutional:      General: He is not in acute distress.    Appearance: Normal appearance. He is well-developed and normal weight. He is not ill-appearing, toxic-appearing or diaphoretic.  HENT:     Head: Normocephalic and atraumatic.     Right Ear: Tympanic membrane, ear canal and external ear normal. There is no impacted cerumen.     Left Ear: Tympanic membrane, ear canal and external ear normal. There is no impacted cerumen.     Nose: Nose normal. No congestion or rhinorrhea.     Mouth/Throat:     Lips: Pink.     Mouth: Mucous membranes are moist.     Pharynx: Oropharynx is clear. Uvula midline. Posterior oropharyngeal erythema present. No uvula swelling.     Tonsils: No tonsillar exudate or tonsillar abscesses.     Comments: Hard and soft palate with a white film, able to be scraped off. Small developing ulcerations to soft palate. No swelling to uvula Eyes:     General: No scleral icterus.       Right eye: No discharge.        Left eye: No discharge.     Extraocular Movements: Extraocular movements intact.     Conjunctiva/sclera: Conjunctivae normal.     Pupils: Pupils are equal, round, and reactive to light.  Cardiovascular:     Rate and Rhythm: Normal rate and regular rhythm.     Heart sounds: No  murmur heard. Pulmonary:     Effort: Pulmonary effort is normal. No respiratory distress.     Breath sounds: Normal breath sounds.  Abdominal:     Palpations: Abdomen is soft.     Tenderness: There is no abdominal tenderness.  Musculoskeletal:        General: No swelling.     Cervical back: Normal range of motion and neck supple. No rigidity or tenderness.  Lymphadenopathy:     Cervical: No cervical adenopathy.  Skin:    General: Skin is warm and dry.     Capillary Refill: Capillary refill takes less than 2 seconds.  Neurological:     Mental Status: He is alert.  Psychiatric:        Mood and Affect: Mood normal.     UC Treatments / Results  Labs (all labs ordered are listed, but only abnormal results are displayed) Labs Reviewed  POCT RAPID STREP A, ED / UC    EKG   Radiology No results found.  Procedures Procedures (including critical care time)  Medications Ordered in UC Medications - No data to display  Initial Impression / Assessment and Plan / UC Course  I have reviewed the triage vital signs and the nursing notes.  Pertinent labs & imaging results that were available during my care of the patient were reviewed by me and considered in my medical decision making (see chart for details).     Oral thrush - rapid strep obtained, negative. Suspect pain secondary to thrush which appears to be causing small ulceration to soft palate. Nystatin swish and spit discussed. RTC precautions reviewed.  Final Clinical Impressions(s) / UC Diagnoses   Final diagnoses:  Oral thrush     Discharge Instructions      Your strep swab is negative. Your discomfort is likely coming from oral thrush Please start using the mouthwash prescribed four times daily, please swish in mouth for 5 minutes prior to spitting out. Return to clinic if symptoms do not improve or any mild changes. Head to ER if any difficulty swallowing or breathing.      ED Prescriptions      Medication Sig Dispense Auth. Provider   nystatin (MYCOSTATIN) 100000 UNIT/ML suspension Take 5 mLs (500,000 Units  total) by mouth 4 (four) times daily. Swish in mouth for 5 minutes prior to spitting out 473 mL Tyesha Joffe L, PA      PDMP not reviewed this encounter.   Maretta Bees, [place] 07/01/21 1622

## 2021-07-01 NOTE — Discharge Instructions (Addendum)
Your strep swab is negative. Your discomfort is likely coming from oral thrush Please start using the mouthwash prescribed four times daily, please swish in mouth for 5 minutes prior to spitting out. Return to clinic if symptoms do not improve or any mild changes. Head to ER if any difficulty swallowing or breathing.

## 2021-07-02 ENCOUNTER — Telehealth (HOSPITAL_COMMUNITY): Payer: Self-pay | Admitting: Emergency Medicine

## 2021-07-02 MED ORDER — NYSTATIN 100000 UNIT/ML MT SUSP: 500000.0000 [IU] | Freq: Four times a day (QID) | OROMUCOSAL | 0 refills | Status: DC

## 2021-07-02 NOTE — Telephone Encounter (Signed)
Pharmacy's power was out yesterday and prescription did not go through, resending

## 2021-09-01 ENCOUNTER — Telehealth: Payer: PRIVATE HEALTH INSURANCE

## 2021-09-05 ENCOUNTER — Encounter (HOSPITAL_COMMUNITY): Payer: Self-pay

## 2021-09-05 ENCOUNTER — Ambulatory Visit (HOSPITAL_COMMUNITY)
Admission: EM | Admit: 2021-09-05 | Discharge: 2021-09-05 | Disposition: A | Discharge status: Home / Self Care | Payer: PRIVATE HEALTH INSURANCE | Attending: Internal Medicine | Admitting: Internal Medicine

## 2021-09-05 DIAGNOSIS — R360 Urethral discharge without blood: Secondary | ICD-10-CM | POA: Diagnosis present

## 2021-09-05 MED ORDER — CEFTRIAXONE SODIUM 500 MG IJ SOLR
INTRAMUSCULAR | Status: AC
  Filled 2021-09-05: qty 500

## 2021-09-05 MED ORDER — CEFTRIAXONE SODIUM 500 MG IJ SOLR
500.0000 mg | Freq: Once | INTRAMUSCULAR | Status: DC
  Administered 2021-09-05: 500 mg via INTRAMUSCULAR

## 2021-09-05 NOTE — Discharge Instructions (Signed)
Please abstain from sexual intercourse for 7 days to allow the medications to work Will call you with recommendations if labs are abnormal I will recommend you follow-up with your clinic who did testing for syphilis and HIV for the results Safe sex practices recommended Return to urgent care if you have any other concerns.

## 2021-09-05 NOTE — ED Triage Notes (Signed)
Pt c/o burning on urination and penile discharge x5 days.

## 2021-09-06 LAB — CYTOLOGY, (ORAL, ANAL, URETHRAL) ANCILLARY ONLY
Chlamydia: NEGATIVE
Comment: NEGATIVE
Comment: NEGATIVE
Comment: NORMAL
Neisseria Gonorrhea: POSITIVE — AB
Trichomonas: NEGATIVE

## 2021-09-07 NOTE — ED Provider Notes (Signed)
[facility]    CSN: [id number] Arrival date & time: 09/05/21  1154      History   Chief Complaint Chief Complaint  Patient presents with   SEXUALLY TRANSMITTED DISEASE    HPI Joshua Harvey is a 26 y.o. male comes to the urgent care with a 5-day history of penile discharge.  Patient engaged in sexual intercourse with a random sexual partner.  Intercourse with vaginal and unprotected.  He describes the discharge as yellowish and associated with burning sensation on urination.  No groin or scrotal swelling.  No ulcerations or rash on the penis or in the groin area.  No abdominal pain. HPI  History reviewed. No pertinent past medical history.  There are no problems to display for this patient.   History reviewed. No pertinent surgical history.     Home Medications    Prior to Admission medications   Not on File    Family History Family History  Family history unknown: Yes    Social History Social History   Tobacco Use   Smoking status: Every Day    Types: Cigarettes   Smokeless tobacco: Never  Vaping Use   Vaping Use: Never used  Substance Use Topics   Alcohol use: Yes   Drug use: Yes    Types: Marijuana     Allergies   Patient has no known allergies.   Review of Systems Review of Systems  Respiratory: Negative.    Gastrointestinal: Negative.   Genitourinary:  Positive for dysuria, penile discharge and penile pain. Negative for penile swelling, scrotal swelling, testicular pain and urgency.     Physical Exam Triage Vital Signs ED Triage Vitals [09/05/21 1213]  Enc Vitals Group     BP 109/69     Pulse Rate 96     Resp 18     Temp 98.4 F (36.9 C)     Temp Source Oral     SpO2 100 %     Weight      Height      Head Circumference      Peak Flow      Pain Score 7     Pain Loc      Pain Edu?      Excl. in GC?    No data found.  Updated Vital Signs BP 109/69 (BP Location: Left Arm)   Pulse 96   Temp 98.4 F (36.9 C)  (Oral)   Resp 18   SpO2 100%   Visual Acuity Right Eye Distance:   Left Eye Distance:   Bilateral Distance:    Right Eye Near:   Left Eye Near:    Bilateral Near:     Physical Exam Vitals and nursing note reviewed.  Constitutional:      General: He is not in acute distress.    Appearance: He is not ill-appearing.  Cardiovascular:     Rate and Rhythm: Normal rate and regular rhythm.  Abdominal:     General: Abdomen is flat. Bowel sounds are normal.     Palpations: Abdomen is soft.  Musculoskeletal:        General: Normal range of motion.  Neurological:     Mental Status: He is alert.      UC Treatments / Results  Labs (all labs ordered are listed, but only abnormal results are displayed) Labs Reviewed  CYTOLOGY, (ORAL, ANAL, URETHRAL) ANCILLARY ONLY - Abnormal; Notable for the following components:      Result Value  Neisseria Gonorrhea Positive (*)    All other components within normal limits    EKG   Radiology No results found.  Procedures Procedures (including critical care time)  Medications Ordered in UC Medications - No data to display  Initial Impression / Assessment and Plan / UC Course  I have reviewed the triage vital signs and the nursing notes.  Pertinent labs & imaging results that were available during my care of the patient were reviewed by me and considered in my medical decision making (see chart for details).     1.  Penile discharge, presumed gonococcal urethritis: Ceftriaxone 500 mg IM Cytology for GC/chlamydia/trichomonas We will call patient with recommendations if labs are normal Abstinence from sexual intercourse recommendations were given to the patient Return precautions given Seizures practices advised. Final Clinical Impressions(s) / UC Diagnoses   Final diagnoses:  Penile discharge, without blood     Discharge Instructions      Please abstain from sexual intercourse for 7 days to allow the medications to  work Will call you with recommendations if labs are abnormal I will recommend you follow-up with your clinic who did testing for syphilis and HIV for the results Safe sex practices recommended Return to urgent care if you have any other concerns.   ED Prescriptions   None    PDMP not reviewed this encounter.   Merrilee Jansky, MD 09/07/21 1630

## 2022-02-27 IMAGING — DX DG ANKLE COMPLETE 3+V*L*
3 series · 3 of 3 positions shown · non-contrast
Comparison: None.

CLINICAL DATA: Left ankle pain

EXAM:
LEFT ANKLE COMPLETE - 3+ VIEW

[ankle ap]
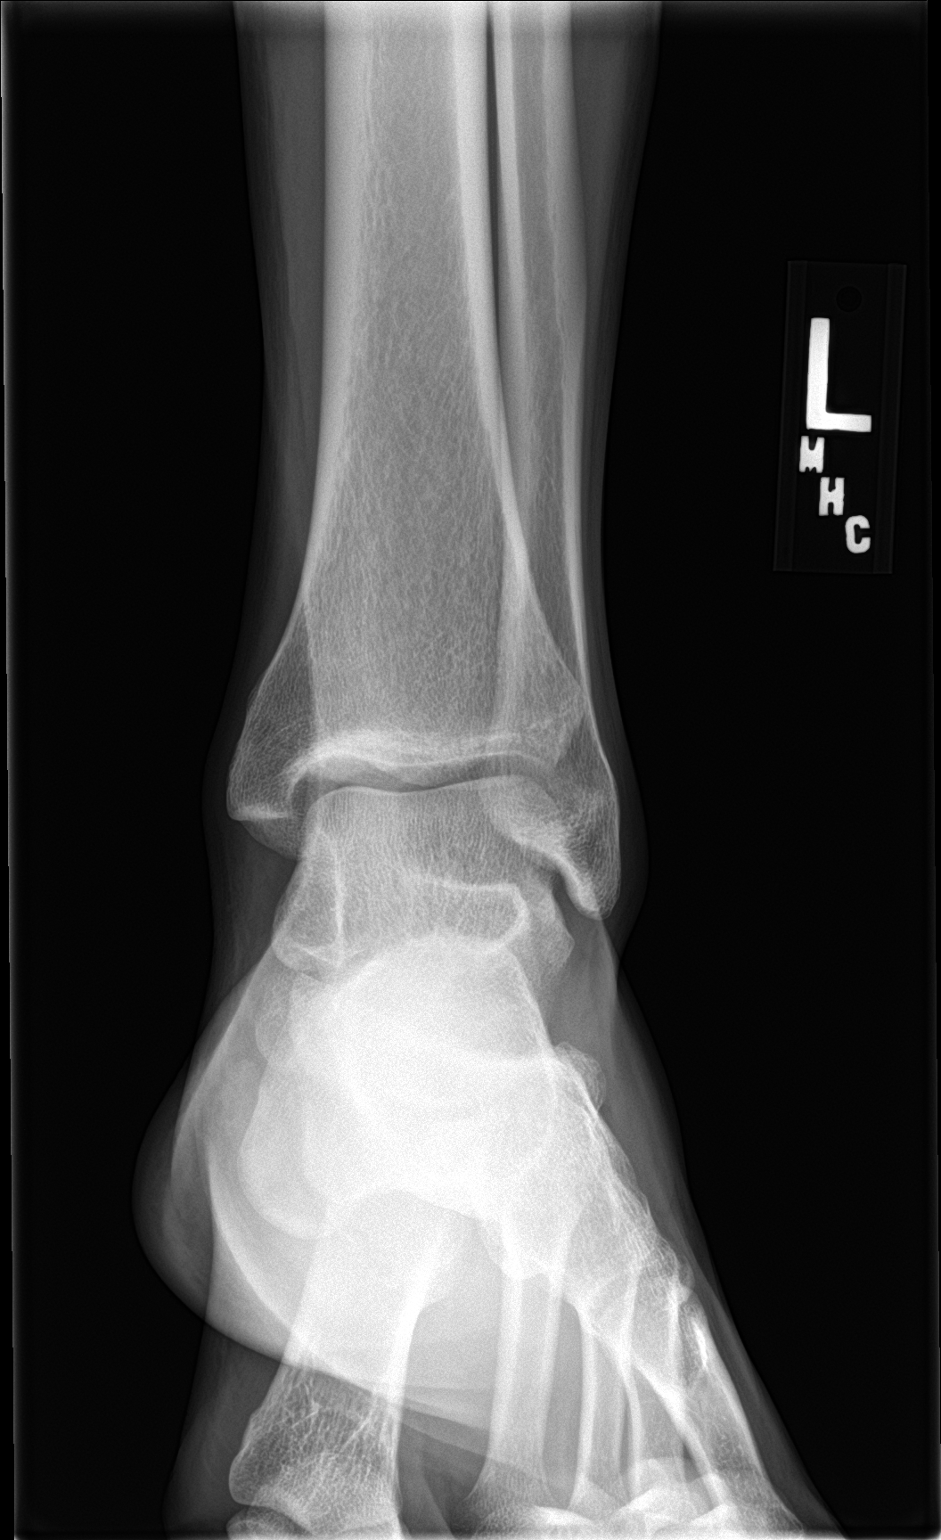

[ankle obl]
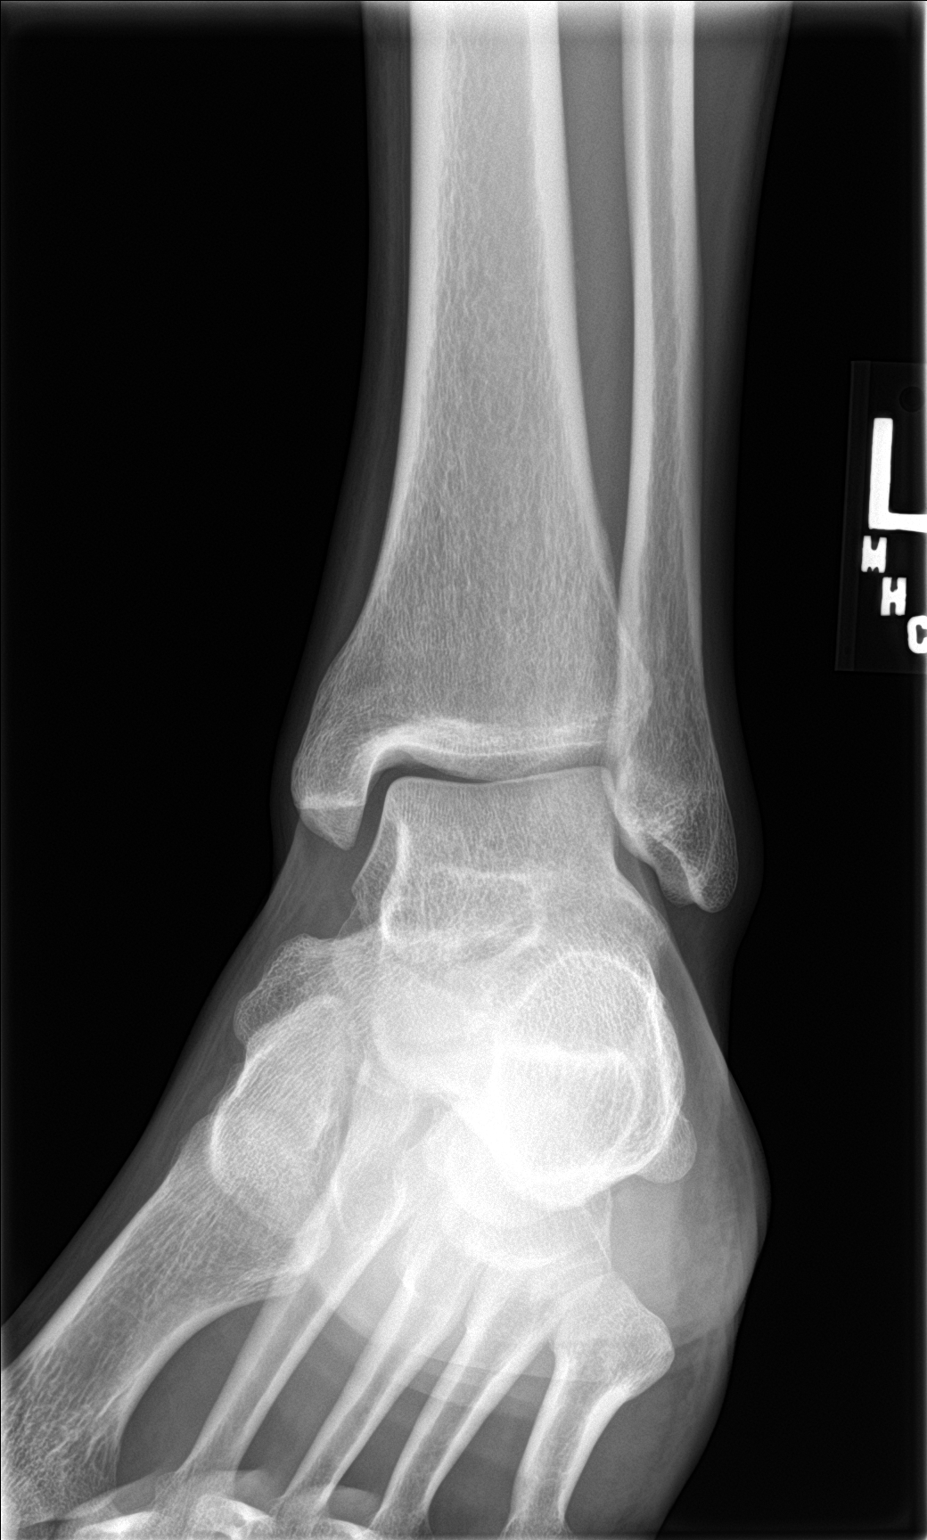

[ankle lat]
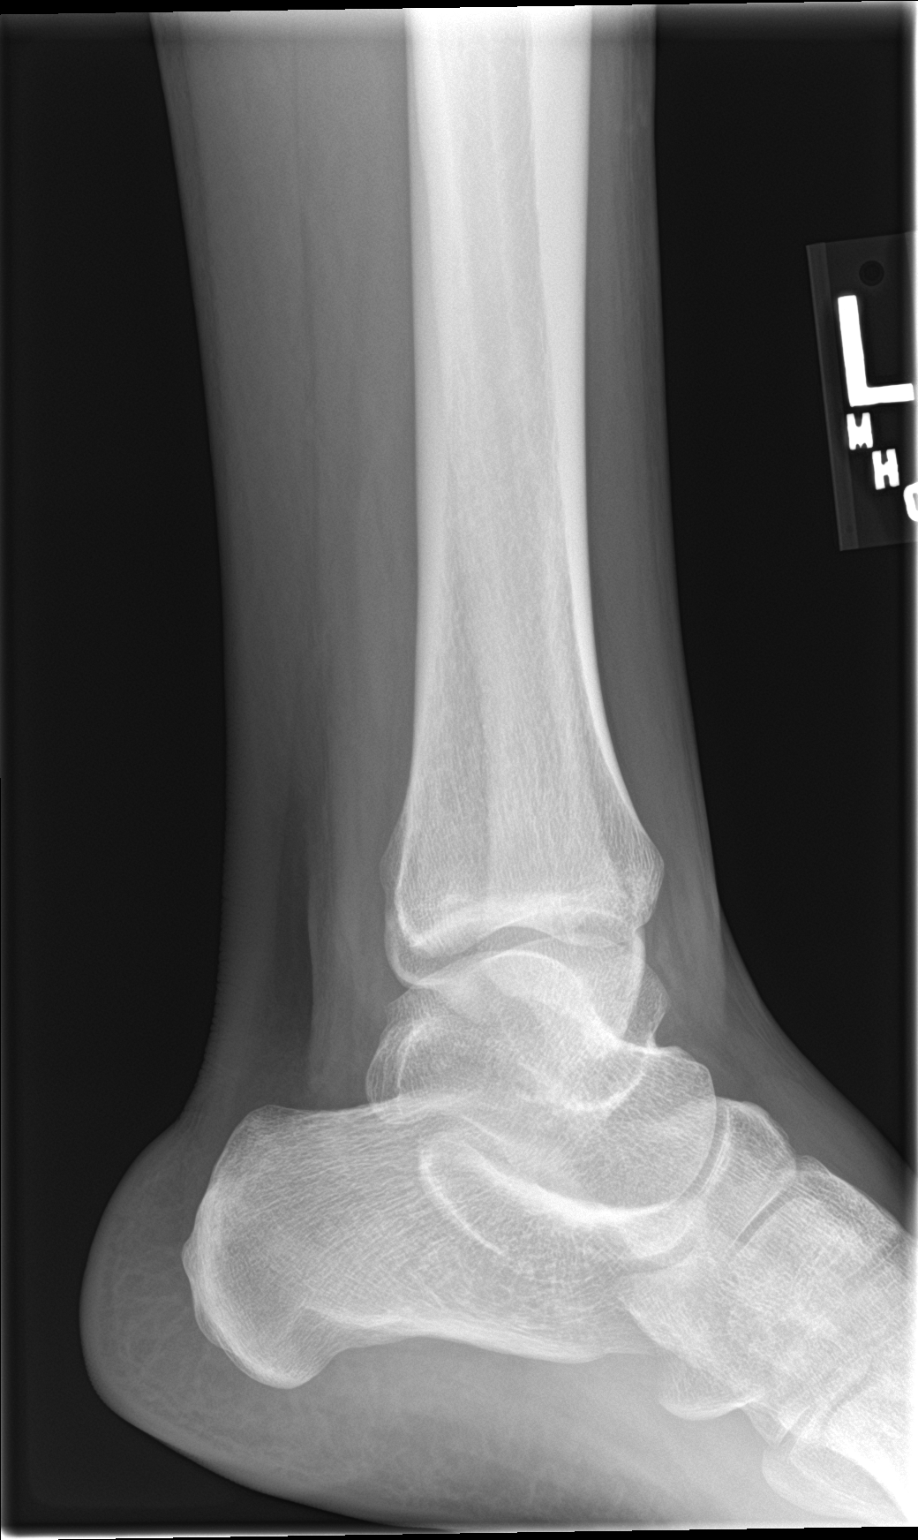

[3 of 3 positions shown; findings below may reference images not displayed]

FINDINGS: There is no evidence of fracture, dislocation, or joint effusion.
There is no evidence of arthropathy or other focal bone abnormality.
Soft tissues are unremarkable.
IMPRESSION: Negative.

## 2023-05-18 ENCOUNTER — Encounter (HOSPITAL_COMMUNITY): Payer: Self-pay

## 2023-05-18 ENCOUNTER — Ambulatory Visit (HOSPITAL_COMMUNITY)
Admission: EM | Admit: 2023-05-18 | Discharge: 2023-05-18 | Disposition: A | Discharge status: Home / Self Care | Attending: Family Medicine | Admitting: Family Medicine

## 2023-05-18 DIAGNOSIS — Z113 Encounter for screening for infections with a predominantly sexual mode of transmission: Secondary | ICD-10-CM

## 2023-05-18 LAB — HIV ANTIBODY (ROUTINE TESTING W REFLEX): HIV Screen 4th Generation wRfx: NONREACTIVE

## 2023-05-18 NOTE — ED Provider Notes (Signed)
 [facility] [employer]  Note:  This document was prepared using Dragon voice recognition software and may include unintentional dictation errors.  MRN: [id number] DOB: Nov 01, 1995  Subjective:   Joshua Harvey is a 28 y.o. male presenting for STD screening.  Patient reports mildly irritated bump to the inside of his urethra.  Patient reports that partner told him they have a yeast infection patient is concern for STDs.  Patient would like all STD testing performed today in UC.  Patient denies any penile discharge, dysuria, penile lesion, abdominal pain, flank pain, nausea vomiting, diarrhea.  Patient denies any past history of diagnosed STD.  No current facility-administered medications for this encounter.  Current Outpatient Medications:    amoxicillin (AMOXIL) 500 MG capsule, Take 500 mg by mouth every 8 (eight) hours., Disp: , Rfl:    ibuprofen (ADVIL) 800 MG tablet, Take 800 mg by mouth every 6 (six) hours as needed., Disp: , Rfl:    No Known Allergies  History reviewed. No pertinent past medical history.   History reviewed. No pertinent surgical history.  Family History  Family history unknown: Yes    Social History   Tobacco Use   Smoking status: Every Day    Types: Cigarettes   Smokeless tobacco: Never  Vaping Use   Vaping status: Never Used  Substance Use Topics   Alcohol use: Yes   Drug use: Yes    Types: Marijuana    ROS Refer to HPI for ROS details.  Objective:   Vitals: BP 104/67 (BP Location: Right Arm)   Pulse 98   Temp 99.4 F (37.4 C) (Oral)   Resp 16   Ht 6' (1.829 m)   Wt 148 lb (67.1 kg)   SpO2 95%   BMI 20.07 kg/m   Physical Exam Vitals and nursing note reviewed.  Constitutional:      General: He is not in acute distress.    Appearance: Normal appearance. He is well-developed. He is not ill-appearing or toxic-appearing.  HENT:     Head: Normocephalic.  Cardiovascular:     Rate and Rhythm: Normal rate.  Pulmonary:     Effort:  Pulmonary effort is normal. No respiratory distress.  Abdominal:     General: There is no distension.     Palpations: Abdomen is soft.     Tenderness: There is no abdominal tenderness. There is no right CVA tenderness, left CVA tenderness, guarding or rebound.  Skin:    General: Skin is warm and dry.  Neurological:     General: No focal deficit present.     Mental Status: He is alert and oriented to person, place, and time.  Psychiatric:        Mood and Affect: Mood normal.        Behavior: Behavior normal.     Procedures  No results found for this or any previous visit (from the past 24 hours).  No results found.   Assessment and Plan :     Discharge Instructions      1. Screen for STD (sexually transmitted disease) (Primary) - Cytology swab collected in UC and sent to lab for further testing.   - HIV Antibody (routine testing w rflx) collected and sent to lab - RPR collected and sent to lab - Results for all testing should be available in 1 to 2 days via MyChart. -Continue to monitor symptoms for any change in severity if there is any escalation of current symptoms or development of new symptoms follow-up in  ER for further evaluation and management.       Alexei Doswell B Shaquoya Cosper   Donnovan Stamour, [place], [place] 05/18/23 1549

## 2023-05-18 NOTE — ED Triage Notes (Signed)
 Patient here today with c/o penis discomfort. Patient is concerned with Stds. Patient states that his partner told him that they had a yeast infection.

## 2023-05-18 NOTE — Discharge Instructions (Addendum)
 1. Screen for STD (sexually transmitted disease) (Primary) - Cytology swab collected in UC and sent to lab for further testing.   - HIV Antibody (routine testing w rflx) collected and sent to lab - RPR collected and sent to lab - Results for all testing should be available in 1 to 2 days via MyChart. -Continue to monitor symptoms for any change in severity if there is any escalation of current symptoms or development of new symptoms follow-up in ER for further evaluation and management.

## 2023-05-19 LAB — CYTOLOGY, (ORAL, ANAL, URETHRAL) ANCILLARY ONLY
Chlamydia: NEGATIVE
Comment: NEGATIVE
Comment: NEGATIVE
Comment: NORMAL
Neisseria Gonorrhea: NEGATIVE
Trichomonas: NEGATIVE

## 2023-05-19 LAB — RPR: RPR Ser Ql: NONREACTIVE

## 2023-05-22 ENCOUNTER — Encounter (HOSPITAL_COMMUNITY): Payer: Self-pay

## 2023-07-03 ENCOUNTER — Encounter (HOSPITAL_COMMUNITY): Payer: Self-pay | Admitting: *Deleted

## 2023-07-03 ENCOUNTER — Emergency Department (HOSPITAL_COMMUNITY)

## 2023-07-03 ENCOUNTER — Other Ambulatory Visit: Payer: Self-pay

## 2023-07-03 ENCOUNTER — Encounter (HOSPITAL_COMMUNITY): Payer: Self-pay

## 2023-07-03 ENCOUNTER — Ambulatory Visit (HOSPITAL_COMMUNITY)
Admission: EM | Admit: 2023-07-03 | Discharge: 2023-07-03 | Disposition: A | Discharge status: Other Acute Inpatient Hospital

## 2023-07-03 ENCOUNTER — Emergency Department (HOSPITAL_COMMUNITY)
Admission: EM | Admit: 2023-07-03 | Discharge: 2023-07-03 | Disposition: A | Discharge status: Home / Self Care | Attending: Emergency Medicine | Admitting: Emergency Medicine

## 2023-07-03 DIAGNOSIS — K920 Hematemesis: Secondary | ICD-10-CM | POA: Diagnosis not present

## 2023-07-03 DIAGNOSIS — F1010 Alcohol abuse, uncomplicated: Secondary | ICD-10-CM | POA: Diagnosis not present

## 2023-07-03 DIAGNOSIS — K921 Melena: Secondary | ICD-10-CM | POA: Diagnosis not present

## 2023-07-03 DIAGNOSIS — R1012 Left upper quadrant pain: Secondary | ICD-10-CM

## 2023-07-03 DIAGNOSIS — F17210 Nicotine dependence, cigarettes, uncomplicated: Secondary | ICD-10-CM | POA: Diagnosis not present

## 2023-07-03 DIAGNOSIS — K2921 Alcoholic gastritis with bleeding: Secondary | ICD-10-CM | POA: Insufficient documentation

## 2023-07-03 DIAGNOSIS — K760 Fatty (change of) liver, not elsewhere classified: Secondary | ICD-10-CM | POA: Diagnosis not present

## 2023-07-03 DIAGNOSIS — Y901 Blood alcohol level of 20-39 mg/100 ml: Secondary | ICD-10-CM | POA: Diagnosis not present

## 2023-07-03 LAB — COMPREHENSIVE METABOLIC PANEL WITH GFR
ALT: 219 U/L — ABNORMAL HIGH (ref 0–44)
AST: 208 U/L — ABNORMAL HIGH (ref 15–41)
Albumin: 3.5 g/dL (ref 3.5–5.0)
Alkaline Phosphatase: 114 U/L (ref 38–126)
Anion gap: 14 (ref 5–15)
BUN: 7 mg/dL (ref 6–20)
CO2: 21 mmol/L — ABNORMAL LOW (ref 22–32)
Calcium: 9.1 mg/dL (ref 8.9–10.3)
Chloride: 101 mmol/L (ref 98–111)
Creatinine, Ser: 0.95 mg/dL (ref 0.61–1.24)
GFR, Estimated: >60 mL/min (ref >60)
Glucose, Bld: 94 mg/dL (ref 70–99)
Potassium: 4.2 mmol/L (ref 3.5–5.1)
Sodium: 136 mmol/L (ref 135–145)
Total Bilirubin: 1.3 mg/dL — ABNORMAL HIGH (ref 0.0–1.2)
Total Protein: 6.7 g/dL (ref 6.5–8.1)

## 2023-07-03 LAB — CBC WITH DIFFERENTIAL/PLATELET
Abs Immature Granulocytes: 0.02 10*3/uL (ref 0.00–0.07)
Basophils Absolute: 0.1 10*3/uL (ref 0.0–0.1)
Basophils Relative: 1 %
Eosinophils Absolute: 0 10*3/uL (ref 0.0–0.5)
Eosinophils Relative: 1 %
HCT: 46.6 % (ref 39.0–52.0)
Hemoglobin: 16.2 g/dL (ref 13.0–17.0)
Immature Granulocytes: 0 %
Lymphocytes Relative: 43 %
Lymphs Abs: 2.4 10*3/uL (ref 0.7–4.0)
MCH: 32.2 pg (ref 26.0–34.0)
MCHC: 34.8 g/dL (ref 30.0–36.0)
MCV: 92.6 fL (ref 80.0–100.0)
Monocytes Absolute: 0.4 10*3/uL (ref 0.1–1.0)
Monocytes Relative: 8 %
Neutro Abs: 2.6 10*3/uL (ref 1.7–7.7)
Neutrophils Relative %: 47 %
Platelets: 158 10*3/uL (ref 150–400)
RBC: 5.03 MIL/uL (ref 4.22–5.81)
RDW: 13.6 % (ref 11.5–15.5)
WBC: 5.5 10*3/uL (ref 4.0–10.5)
nRBC: 0 % (ref 0.0–0.2)

## 2023-07-03 LAB — URINALYSIS, ROUTINE W REFLEX MICROSCOPIC
Bilirubin Urine: NEGATIVE
Glucose, UA: NEGATIVE mg/dL
Hgb urine dipstick: NEGATIVE
Ketones, ur: NEGATIVE mg/dL
Leukocytes,Ua: NEGATIVE
Nitrite: NEGATIVE
Protein, ur: NEGATIVE mg/dL
Specific Gravity, Urine: 1.016 (ref 1.005–1.030)
pH: 5 (ref 5.0–8.0)

## 2023-07-03 LAB — RAPID URINE DRUG SCREEN, HOSP PERFORMED
Amphetamines: NOT DETECTED
Barbiturates: NOT DETECTED
Benzodiazepines: NOT DETECTED
Cocaine: NOT DETECTED
Opiates: NOT DETECTED
Tetrahydrocannabinol: POSITIVE — AB

## 2023-07-03 LAB — ETHANOL: Alcohol, Ethyl (B): 23 mg/dL — ABNORMAL HIGH (ref <15)

## 2023-07-03 LAB — TYPE AND SCREEN
ABO/RH(D): O POS
Antibody Screen: NEGATIVE

## 2023-07-03 LAB — LIPASE, BLOOD: Lipase: 23 U/L (ref 11–51)

## 2023-07-03 MED ORDER — IOHEXOL 350 MG/ML SOLN
75.0000 mL | Freq: Once | INTRAVENOUS | Status: AC | PRN
  Administered 2023-07-03: 75 mL via INTRAVENOUS

## 2023-07-03 MED ORDER — PANTOPRAZOLE SODIUM 40 MG PO TBEC: 40.0000 mg | DELAYED_RELEASE_TABLET | Freq: Every day | ORAL | 0 refills | Status: AC

## 2023-07-03 NOTE — ED Notes (Signed)
 Currently not in room.

## 2023-07-03 NOTE — ED Provider Notes (Signed)
 [facility]    CSN: [id number] Arrival date & time: 07/03/23  1009      History   Chief Complaint Chief Complaint  Patient presents with   Hematemesis    HPI Joshua Harvey is a 28 y.o. male.   Joshua Harvey is a 28 y.o. male presenting for chief complaint of Hematemesis and left upper quadrant abdominal cramping that started 2 weeks ago.  He's had an episode of bilious vomiting almost every morning for the last 2 weeks. He started seeing dark brown blood in emesis a few days ago causing concern. States vomiting mostly happens in the mornings when he has not had anything to eat. He is starting to feel lightheaded and dizzy with position changes. Additionally reports heart burn/acid reflux symptoms frequently.  He has had some bright red blood in his stools for the last 2 weeks as well.  Drinks at least 2 beers per day, 4 at most. Starts to feel a slight tremor with abrupt cessation of alcohol intake. Denies headaches, visual disturbance, hallucinations.  Denies recent NSAID use, intake of spicy/acidic foods, and history of GI bleeding. He does not take blood thinners.      History reviewed. No pertinent past medical history.  There are no active problems to display for this patient.   History reviewed. No pertinent surgical history.     Home Medications    Prior to Admission medications   Medication Sig Start Date End Date Taking? Authorizing Provider  amoxicillin (AMOXIL) 500 MG capsule Take 500 mg by mouth every 8 (eight) hours. 04/22/23   [provider]  ibuprofen (ADVIL) 800 MG tablet Take 800 mg by mouth every 6 (six) hours as needed. 04/22/23   [provider]    Family History Family History  Family history unknown: Yes    Social History Social History   Tobacco Use   Smoking status: Every Day    Types: Cigarettes   Smokeless tobacco: Never  Vaping Use   Vaping status: Never Used  Substance Use Topics   Alcohol use: Yes    Drug use: Yes    Types: Marijuana     Allergies   Patient has no known allergies.   Review of Systems Review of Systems Per HPI  Physical Exam Triage Vital Signs ED Triage Vitals  Encounter Vitals Group     BP 07/03/23 1044 (!) 125/94     Systolic BP Percentile --      Diastolic BP Percentile --      Pulse Rate 07/03/23 1044 93     Resp 07/03/23 1044 16     Temp 07/03/23 1044 98.2 F (36.8 C)     Temp src --      SpO2 07/03/23 1044 96 %     Weight --      Height --      Head Circumference --      Peak Flow --      Pain Score 07/03/23 1043 0     Pain Loc --      Pain Education --      Exclude from Growth Chart --    No data found.  Updated Vital Signs BP (!) 125/94   Pulse 93   Temp 98.2 F (36.8 C)   Resp 16   SpO2 96%   Visual Acuity Right Eye Distance:   Left Eye Distance:   Bilateral Distance:    Right Eye Near:   Left Eye Near:  Bilateral Near:     Physical Exam Vitals and nursing note reviewed.  Constitutional:      Appearance: He is not ill-appearing or toxic-appearing.  HENT:     Head: Normocephalic and atraumatic.     Right Ear: Hearing and external ear normal.     Left Ear: Hearing and external ear normal.     Nose: Nose normal.     Mouth/Throat:     Lips: Pink.     Mouth: Mucous membranes are moist.     Pharynx: No posterior oropharyngeal erythema.  Eyes:     General: Lids are normal. Vision grossly intact. Gaze aligned appropriately.     Extraocular Movements: Extraocular movements intact.     Conjunctiva/sclera: Conjunctivae normal.  Pulmonary:     Effort: Pulmonary effort is normal.  Abdominal:     General: Abdomen is flat. Bowel sounds are normal.     Palpations: Abdomen is soft.     Tenderness: There is no abdominal tenderness. There is no right CVA tenderness, left CVA tenderness or guarding.  Musculoskeletal:     Cervical back: Neck supple.  Skin:    General: Skin is warm and dry.     Capillary Refill: Capillary  refill takes less than 2 seconds.     Findings: No rash.  Neurological:     General: No focal deficit present.     Mental Status: He is alert and oriented to person, place, and time. Mental status is at baseline.     Cranial Nerves: No dysarthria or facial asymmetry.  Psychiatric:        Mood and Affect: Mood normal.        Speech: Speech normal.        Behavior: Behavior normal.        Thought Content: Thought content normal.        Judgment: Judgment normal.      UC Treatments / Results  Labs (all labs ordered are listed, but only abnormal results are displayed) Labs Reviewed - No data to display  EKG   Radiology No results found.  Procedures Procedures (including critical care time)  Medications Ordered in UC Medications - No data to display  Initial Impression / Assessment and Plan / UC Course  I have reviewed the triage vital signs and the nursing notes.  Pertinent labs & imaging results that were available during my care of the patient were reviewed by me and considered in my medical decision making (see chart for details).   1. Hematemesis with nausea, excessive drinking of alcohol, LUQ abdominal pain Hematemesis with excessive alcohol intake and left upper quadrant abdominal discomfort suspicious for GI bleed, peptic ulcer, esophageal varices, etc. I would like for him to proceed to the nearest emergency room for further workup and evaluation with STAT labs etc to rule out emergent pathology.  Discussed clinical concerns/exam findings leading to recommendation for further workup in the ER setting and risks of deferring ER visit with patient/family. Patient/family express understanding and agreement with plan, discharged to ER via private car.   Final Clinical Impressions(s) / UC Diagnoses   Final diagnoses:  Hematemesis with nausea  Excessive drinking alcohol  Abdominal pain, left upper quadrant   Discharge Instructions   None    ED Prescriptions    None    PDMP not reviewed this encounter.   Starlene Eaton, [place] 07/03/23 1123

## 2023-07-03 NOTE — ED Notes (Addendum)
 Pt out to the desk asking about DC papers. Offered to print his papers but states he doesn't need them. Attempted to get vitals but pt doesn't want them/says "I don't need them." Pt ambulatory out the door with a steady gait. No IV in his arm.

## 2023-07-03 NOTE — ED Triage Notes (Signed)
 PT reports he has been vomiting blood for the past couple weeks.

## 2023-07-03 NOTE — Discharge Instructions (Addendum)
 We evaluated you for blood in your vomit. Your symptoms are likely due to ibuprofen use and alcohol use. Please stop taking ibuprofen and try to cut back on your drinking. Your liver testing was slightly abnormal likely due to alcohol use. Please follow up with [facility]. We have prescribed you an anti-acid medicine called protonix. Please take this daily. Return for any worsening symptoms.

## 2023-07-03 NOTE — ED Triage Notes (Addendum)
 Pt states he's been having hematemesis for 2 weeks. Last time he threw up was this morning. Pt states blood in stool sometimes. Denies abd pain.

## 2023-07-03 NOTE — ED Provider Notes (Signed)
 [facility] Provider Note  CSN: [id number] Arrival date & time: 07/03/23 1118  Chief Complaint(s) Hematemesis  HPI Joshua Harvey is a 28 y.o. male denies any significant past medical history presenting to the emergency department with episodes of blood in vomit.  Reports that he has been having vomiting, mainly in the mornings.  Seems to be worse after drinking.  Reports that he has had some streaks of blood in his vomit.  Also reports that he has been taking a lot of 800 mg ibuprofen for toothache.  No melena.  Reports some small amount of blood when wiping but no bloody stool.  No chest pain, abdominal pain.  No fevers or chills.  No lightheadedness or dizziness.  Denies any blood thinner use.  Denies similar symptoms.  Initially went to urgent care and was sent here.   Past Medical History History reviewed. No pertinent past medical history. There are no active problems to display for this patient.  Home Medication(s) Prior to Admission medications   Medication Sig Start Date End Date Taking? Authorizing Provider  pantoprazole  (PROTONIX ) 40 MG tablet Take 1 tablet (40 mg total) by mouth daily. 07/03/23  Yes Mordecai Applebaum, MD  amoxicillin (AMOXIL) 500 MG capsule Take 500 mg by mouth every 8 (eight) hours. 04/22/23   [provider]  ibuprofen (ADVIL) 800 MG tablet Take 800 mg by mouth every 6 (six) hours as needed. 04/22/23   [provider]                                                                                                                                    Past Surgical History History reviewed. No pertinent surgical history. Family History Family History  Family history unknown: Yes    Social History Social History   Tobacco Use   Smoking status: Every Day    Types: Cigarettes   Smokeless tobacco: Never  Vaping Use   Vaping status: Never Used  Substance Use Topics   Alcohol use: Yes     Alcohol/week: 3.0 standard drinks of alcohol    Types: 3 Cans of beer per week   Drug use: Yes    Types: Marijuana   Allergies Patient has no known allergies.  Review of Systems Review of Systems  All other systems reviewed and are negative.   Physical Exam Vital Signs  I have reviewed the triage vital signs BP 130/88   Pulse 90   Temp 98.7 F (37.1 C) (Oral)   Resp 16   Ht 6' (1.829 m)   Wt 64.4 kg   SpO2 100%   BMI 19.26 kg/m  Physical Exam Vitals and nursing note reviewed.  Constitutional:      General: He is not in acute distress.    Appearance: Normal appearance.  HENT:     Mouth/Throat:     Mouth: Mucous membranes are moist.  Eyes:     Conjunctiva/sclera: Conjunctivae normal.  Cardiovascular:     Rate and Rhythm: Normal rate and regular rhythm.  Pulmonary:     Effort: Pulmonary effort is normal. No respiratory distress.     Breath sounds: Normal breath sounds.  Abdominal:     General: Abdomen is flat.     Palpations: Abdomen is soft.     Tenderness: There is no abdominal tenderness.  Musculoskeletal:     Right lower leg: No edema.     Left lower leg: No edema.  Skin:    General: Skin is warm and dry.     Capillary Refill: Capillary refill takes less than 2 seconds.  Neurological:     Mental Status: He is alert and oriented to person, place, and time. Mental status is at baseline.  Psychiatric:        Mood and Affect: Mood normal.        Behavior: Behavior normal.     ED Results and Treatments Labs (all labs ordered are listed, but only abnormal results are displayed) Labs Reviewed  ETHANOL - Abnormal; Notable for the following components:      Result Value   Alcohol, Ethyl (B) 23 (*)    All other components within normal limits  RAPID URINE DRUG SCREEN, HOSP PERFORMED - Abnormal; Notable for the following components:   Tetrahydrocannabinol POSITIVE (*)    All other components within normal limits  COMPREHENSIVE METABOLIC PANEL WITH GFR -  Abnormal; Notable for the following components:   CO2 21 (*)    AST 208 (*)    ALT 219 (*)    Total Bilirubin 1.3 (*)    All other components within normal limits  CBC WITH DIFFERENTIAL/PLATELET  URINALYSIS, ROUTINE W REFLEX MICROSCOPIC  LIPASE, BLOOD  TYPE AND SCREEN                                                                                                                          Radiology No results found.  Pertinent labs & imaging results that were available during my care of the patient were reviewed by me and considered in my medical decision making (see MDM for details).  Medications Ordered in ED Medications  iohexol  (OMNIPAQUE ) 350 MG/ML injection 75 mL (75 mLs Intravenous Contrast Given 07/03/23 1806)  Procedures Procedures  (including critical care time)  Medical Decision Making / ED Course   MDM:  28 year old presenting to the emergency department with vomiting.  Patient well-appearing, physical examination with no focal abnormality.  No abdominal tenderness.  Suspect vomiting with some blood related to alcohol use, possibly related to NSAID use.  Differential includes peptic ulcer disease, gastritis, Mallory-Weiss tear.  Patient has been having symptoms for a few weeks and his hemoglobin is normal.  Very low concern for large amount of hematemesis.  Pending labs for metabolic abnormalities.  No abdominal tenderness or pain to suggest pancreatitis, obstruction, perforation, volvulus.  Discussed importance of alcohol cessation and NSAID cessation, if labs are reassuring we will start on PPI, recommend GI follow-up.      Additional history obtained: -Additional history obtained from family -External records from outside source obtained and reviewed including: Chart review including previous notes, labs, imaging, consultation notes  including prior notes    Lab Tests: -I ordered, reviewed, and interpreted labs.   The pertinent results include:   Labs Reviewed  ETHANOL - Abnormal; Notable for the following components:      Result Value   Alcohol, Ethyl (B) 23 (*)    All other components within normal limits  RAPID URINE DRUG SCREEN, HOSP PERFORMED - Abnormal; Notable for the following components:   Tetrahydrocannabinol POSITIVE (*)    All other components within normal limits  COMPREHENSIVE METABOLIC PANEL WITH GFR - Abnormal; Notable for the following components:   CO2 21 (*)    AST 208 (*)    ALT 219 (*)    Total Bilirubin 1.3 (*)    All other components within normal limits  CBC WITH DIFFERENTIAL/PLATELET  URINALYSIS, ROUTINE W REFLEX MICROSCOPIC  LIPASE, BLOOD  TYPE AND SCREEN    Notable for normal hgb. Elevated AST/ALT   Imaging Studies ordered: I ordered imaging studies including CT abdomen On my interpretation imaging demonstrates no acute process I independently visualized and interpreted imaging. I agree with the radiologist interpretation   Medicines ordered and prescription drug management: Meds ordered this encounter  Medications   iohexol  (OMNIPAQUE ) 350 MG/ML injection 75 mL   pantoprazole  (PROTONIX ) 40 MG tablet    Sig: Take 1 tablet (40 mg total) by mouth daily.    Dispense:  30 tablet    Refill:  0    -I have reviewed the patients home medicines and have made adjustments as needed   Social Determinants of Health:  Diagnosis or treatment significantly limited by social determinants of health: alcohol use   Reevaluation: After the interventions noted above, I reevaluated the patient and found that their symptoms have resolved  Co morbidities that complicate the patient evaluation History reviewed. No pertinent past medical history.    Dispostion: Disposition decision including need for hospitalization was considered, and patient discharged from emergency  department.    Final Clinical Impression(s) / ED Diagnoses Final diagnoses:  Acute alcoholic gastritis with hemorrhage     This chart was dictated using voice recognition software.  Despite best efforts to proofread,  errors can occur which can change the documentation meaning.    Mordecai Applebaum, MD 07/07/23 [phone]

## 2023-07-03 NOTE — ED Notes (Signed)
 Patient ambulated to the bathroom with a steady gait

## 2023-07-03 NOTE — ED Notes (Signed)
 Pt refuses lab redraw rn torri v.aware at

## 2023-07-03 NOTE — ED Provider Triage Note (Signed)
 Emergency Medicine Provider Triage Evaluation Note  Joshua Harvey , a 28 y.o. male  was evaluated in triage.  Pt complains of occasional hematemesis off and on for the past 2 weeks.  Patient reports that he drinks around 2 to 3- 40 ounces of beer per day.  He has been having this off and on for the past 2 weeks.  Reports he has occasional bloody bowel movements but not melanotic.  No fevers.  No chest pain or shortness of breath.  No recent cough or cold symptoms.  Denies any abdominal pain.  No excessive bleeding or bruising.  No pain when he urinates or any blood.  Does smoke marijuana daily.  Review of Systems  Positive:  Negative:   Physical Exam  BP (!) 118/100   Pulse 71   Temp 98.5 F (36.9 C) (Oral)   Resp 18   Ht 6' (1.829 m)   Wt 64.4 kg   SpO2 100%   BMI 19.26 kg/m  Gen:   Awake, no distress   Resp:  Normal effort  MSK:   Moves extremities without difficulty  Other:  Abdomen soft and nontender.  Vomit present in trash can however is clear, no blood present.  Medical Decision Making  Medically screening exam initiated at 12:43 PM.  Appropriate orders placed.  Joshua Harvey was informed that the remainder of the evaluation will be completed by another provider, this initial triage assessment does not replace that evaluation, and the importance of remaining in the ED until their evaluation is complete.  Labs and CT imaging ordered.   Spence Dux, [place] 07/03/23 1244
